# Patient Record
Sex: Male | Born: 1950
Health system: Southern US, Community
[De-identification: ages and names within clinical notes are randomized; demographics above are authoritative.]

## PROBLEM LIST (undated history)

## (undated) DIAGNOSIS — E785 Hyperlipidemia, unspecified: Secondary | ICD-10-CM

## (undated) HISTORY — DX: Hyperlipidemia, unspecified: E78.5

---

## 2017-09-30 DIAGNOSIS — Z136 Encounter for screening for cardiovascular disorders: Secondary | ICD-10-CM | POA: Diagnosis not present

## 2017-09-30 DIAGNOSIS — Z125 Encounter for screening for malignant neoplasm of prostate: Secondary | ICD-10-CM | POA: Diagnosis not present

## 2017-09-30 DIAGNOSIS — I10 Essential (primary) hypertension: Secondary | ICD-10-CM | POA: Diagnosis not present

## 2017-09-30 DIAGNOSIS — Z0000 Encounter for general adult medical examination without abnormal findings: Secondary | ICD-10-CM | POA: Diagnosis not present

## 2017-09-30 DIAGNOSIS — Z6828 Body mass index (BMI) 28.0-28.9, adult: Secondary | ICD-10-CM | POA: Diagnosis not present

## 2017-09-30 DIAGNOSIS — Z1389 Encounter for screening for other disorder: Secondary | ICD-10-CM | POA: Diagnosis not present

## 2017-09-30 DIAGNOSIS — M545 Low back pain: Secondary | ICD-10-CM | POA: Diagnosis not present

## 2017-09-30 DIAGNOSIS — E7800 Pure hypercholesterolemia, unspecified: Secondary | ICD-10-CM | POA: Diagnosis not present

## 2017-09-30 DIAGNOSIS — Z23 Encounter for immunization: Secondary | ICD-10-CM | POA: Diagnosis not present

## 2017-09-30 DIAGNOSIS — N529 Male erectile dysfunction, unspecified: Secondary | ICD-10-CM | POA: Diagnosis not present

## 2018-04-27 DIAGNOSIS — I809 Phlebitis and thrombophlebitis of unspecified site: Secondary | ICD-10-CM | POA: Diagnosis not present

## 2018-04-27 DIAGNOSIS — H9313 Tinnitus, bilateral: Secondary | ICD-10-CM | POA: Diagnosis not present

## 2018-04-27 DIAGNOSIS — K429 Umbilical hernia without obstruction or gangrene: Secondary | ICD-10-CM | POA: Diagnosis not present

## 2018-10-04 DIAGNOSIS — M545 Low back pain: Secondary | ICD-10-CM | POA: Diagnosis not present

## 2018-10-04 DIAGNOSIS — Z0000 Encounter for general adult medical examination without abnormal findings: Secondary | ICD-10-CM | POA: Diagnosis not present

## 2018-10-04 DIAGNOSIS — K429 Umbilical hernia without obstruction or gangrene: Secondary | ICD-10-CM | POA: Diagnosis not present

## 2018-10-04 DIAGNOSIS — N529 Male erectile dysfunction, unspecified: Secondary | ICD-10-CM | POA: Diagnosis not present

## 2018-10-04 DIAGNOSIS — Z125 Encounter for screening for malignant neoplasm of prostate: Secondary | ICD-10-CM | POA: Diagnosis not present

## 2018-10-04 DIAGNOSIS — I10 Essential (primary) hypertension: Secondary | ICD-10-CM | POA: Diagnosis not present

## 2018-10-04 DIAGNOSIS — E7800 Pure hypercholesterolemia, unspecified: Secondary | ICD-10-CM | POA: Diagnosis not present

## 2018-10-04 DIAGNOSIS — H9313 Tinnitus, bilateral: Secondary | ICD-10-CM | POA: Diagnosis not present

## 2018-10-04 DIAGNOSIS — Z1389 Encounter for screening for other disorder: Secondary | ICD-10-CM | POA: Diagnosis not present

## 2018-10-17 DIAGNOSIS — I10 Essential (primary) hypertension: Secondary | ICD-10-CM | POA: Diagnosis not present

## 2018-10-17 DIAGNOSIS — E7800 Pure hypercholesterolemia, unspecified: Secondary | ICD-10-CM | POA: Diagnosis not present

## 2018-10-17 DIAGNOSIS — Z125 Encounter for screening for malignant neoplasm of prostate: Secondary | ICD-10-CM | POA: Diagnosis not present

## 2019-01-02 DIAGNOSIS — H2513 Age-related nuclear cataract, bilateral: Secondary | ICD-10-CM | POA: Diagnosis not present

## 2019-01-02 DIAGNOSIS — H524 Presbyopia: Secondary | ICD-10-CM | POA: Diagnosis not present

## 2019-01-02 DIAGNOSIS — H52223 Regular astigmatism, bilateral: Secondary | ICD-10-CM | POA: Diagnosis not present

## 2019-01-02 DIAGNOSIS — H5203 Hypermetropia, bilateral: Secondary | ICD-10-CM | POA: Diagnosis not present

## 2019-02-01 DIAGNOSIS — Z23 Encounter for immunization: Secondary | ICD-10-CM | POA: Diagnosis not present

## 2019-10-08 ENCOUNTER — Other Ambulatory Visit: Payer: Self-pay | Admitting: Family Medicine

## 2019-10-08 DIAGNOSIS — I739 Peripheral vascular disease, unspecified: Secondary | ICD-10-CM

## 2019-10-16 ENCOUNTER — Ambulatory Visit
Admission: RE | Admit: 2019-10-16 | Discharge: 2019-10-16 | Disposition: A | Discharge status: Home / Self Care | Payer: Medicare Other | Source: Ambulatory Visit | Attending: Family Medicine | Admitting: Family Medicine

## 2019-10-16 DIAGNOSIS — I739 Peripheral vascular disease, unspecified: Secondary | ICD-10-CM

## 2019-10-16 DIAGNOSIS — I700 Atherosclerosis of aorta: Secondary | ICD-10-CM | POA: Diagnosis not present

## 2019-11-15 ENCOUNTER — Ambulatory Visit (INDEPENDENT_AMBULATORY_CARE_PROVIDER_SITE_OTHER): Payer: Medicare Other | Admitting: Vascular Surgery

## 2019-11-15 ENCOUNTER — Other Ambulatory Visit: Payer: Self-pay

## 2019-11-15 ENCOUNTER — Encounter: Payer: Self-pay | Admitting: Vascular Surgery

## 2019-11-15 VITALS — BP 144/79 | HR 73 | Temp 98.1°F | Resp 20 | Ht 69.0 in | Wt 196.0 lb

## 2019-11-15 DIAGNOSIS — I739 Peripheral vascular disease, unspecified: Secondary | ICD-10-CM | POA: Diagnosis not present

## 2019-11-15 NOTE — Progress Notes (Signed)
Referring Physician: Dr Alyson Ingles  Patient name: Tony Mcbride MRN: [id number] DOB: [birth year] Sex: male  REASON FOR CONSULT: Left leg claudication  HPI: Tony Mcbride is a 69 y.o. male, with about a 4 to 6-week history of increasing pain in his left calf with walking.  He is able to walk about 5 to 6 minutes before he experiences this pain.  It then resolves after a few minutes of rest.  He states that he has noticed that the left leg has been a little bit of a problem for maybe about as long as a year but more acute recently.  He does have a history of smoking.  He currently smokes 1 to 2 cigars/day.  He has smoked for about 30 years.  He does not have a history of diabetes.  He does not experience rest pain.  He has no nonhealing wounds.  Other medical problems include hyperlipidemia and he is on a statin for this.  He also was recently started on aspirin 81 mg daily.  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  Past Medical History:  Diagnosis Date   Hyperlipidemia    History reviewed. No pertinent surgical history.  History reviewed. No pertinent family history.  SOCIAL HISTORY: Social History   Socioeconomic History   Marital status: Divorced    Spouse name: Not on file   Number of children: Not on file   Years of education: Not on file   Highest education level: Not on file  Occupational History   Not on file  Tobacco Use   Smoking status: Current Every Day Smoker    Types: Cigars   Smokeless tobacco: Never Used  Vaping Use   Vaping Use: Never used  Substance and Sexual Activity   Alcohol use: Yes   Drug use: Never   Sexual activity: Not on file  Other Topics Concern   Not on file  Social History Narrative   Not on file   Social Determinants of Health   Financial Resource Strain:    Difficulty of Paying Living Expenses:   Food Insecurity:    Worried About Charity fundraiser in the Last Year:    Arboriculturist in  the Last Year:   Transportation Needs:    Film/video editor (Medical):    Lack of Transportation (Non-Medical):   Physical Activity:    Days of Exercise per Week:    Minutes of Exercise per Session:   Stress:    Feeling of Stress :   Social Connections:    Frequency of Communication with Friends and Family:    Frequency of Social Gatherings with Friends and Family:    Attends Religious Services:    Active Member of Clubs or Organizations:    Attends Music therapist:    Marital Status:   Intimate Partner Violence:    Fear of Current or Ex-Partner:    Emotionally Abused:    Physically Abused:    Sexually Abused:     No Known Allergies  Current Outpatient Medications  Medication Sig Dispense Refill   aspirin EC 81 MG tablet Take 81 mg by mouth daily. Swallow whole.     lovastatin (MEVACOR) 40 MG tablet Take 40 mg by mouth daily.     No current facility-administered medications for this visit.    ROS:   General:  No weight loss, Fever, chills  HEENT: No recent headaches, no nasal bleeding, no visual changes, no  sore throat  Neurologic: No dizziness, blackouts, seizures. No recent symptoms of stroke or mini- stroke. No recent episodes of slurred speech, or temporary blindness.  Cardiac: No recent episodes of chest pain/pressure, no shortness of breath at rest.  No shortness of breath with exertion.  Denies history of atrial fibrillation or irregular heartbeat  Vascular: No history of rest pain in feet.  No history of claudication.  No history of non-healing ulcer, No history of DVT   Pulmonary: No home oxygen, no productive cough, no hemoptysis,  No asthma or wheezing  Musculoskeletal:  [ ]  Arthritis, [ ]  Low back pain,  [ ]  Joint pain  Hematologic:No history of hypercoagulable state.  No history of easy bleeding.  No history of anemia  Gastrointestinal: No hematochezia or melena,  No gastroesophageal reflux, no trouble  swallowing  Urinary: [ ]  chronic Kidney disease, [ ]  on HD - [ ]  MWF or [ ]  TTHS, [ ]  Burning with urination, [ ]  Frequent urination, [ ]  Difficulty urinating;   Skin: No rashes  Psychological: No history of anxiety,  No history of depression   Physical Examination  Vitals:   11/15/19 0826  BP: (!) 144/79  Pulse: 73  Resp: 20  Temp: 98.1 F (36.7 C)  SpO2: 96%  Weight: 196 lb (88.9 kg)  Height: 5\' 9"  (1.753 m)    Body mass index is 28.94 kg/m.  General:  Alert and oriented, no acute distress HEENT: Normal Neck: No JVD Cardiac: Regular Rate and Rhythm Abdomen: Soft, non-tender, non-distended, no mass, umbilical hernia Skin: No rash Extremity Pulses:  2+ radial, brachial, femoral, 1 + right popliteal dorsalis pedis, 2+ right absent left posterior tibial pulses absent DP bilaterally Musculoskeletal: No deformity or edema  Neurologic: Upper and lower extremity motor 5/5 and symmetric  DATA:  I reviewed the patient's recent ABIs from [facility] imaging from October 16, 2019.  Right side was 0.97 left side 0.52 waveforms consistent with left SFA occlusion.  Also suggestion with discrepancy in blood pressures right versus left arm of possible subclavian stenosis on the right.  The report did not mention actual numbers.  ASSESSMENT: Claudication left leg secondary most likely to left SFA occlusion.  Patient is limited a little bit in his walking distance but currently does not feel completely disabled by this.  He is not currently at risk of limb loss.  I discussed with him that if he is able to quit tobacco products completely his risk of limb loss lifetime is less than 5%.  As far as the right versus left blood pressure discrepancy is concerned his pulses are fairly symmetric so I do not believe this is necessarily a flow-limiting or clinically relevant stenosis.   PLAN: 1.  Continue aspirin and statin  2.  Patient will follow up with [place] in 6 months time in our APP clinic for repeat  ABIs.  3.  Patient will start an exercise program of walking for 30 minutes daily to try to increase his walking distance overall.  I did discuss with the patient the possibility of a intervention with arteriogram possible angioplasty or stenting.  He currently does not feel his lifestyle is limited enough to consider this for now.  We also discussed durability with this.  If he changes his mind we would consider that in the future.   Ruta Hinds, MD [facility] [place] Office: [phone]

## 2019-11-16 ENCOUNTER — Other Ambulatory Visit: Payer: Self-pay | Admitting: *Deleted

## 2019-11-16 DIAGNOSIS — I739 Peripheral vascular disease, unspecified: Secondary | ICD-10-CM

## 2019-11-26 DIAGNOSIS — E1159 Type 2 diabetes mellitus with other circulatory complications: Secondary | ICD-10-CM | POA: Diagnosis not present

## 2019-11-26 DIAGNOSIS — I739 Peripheral vascular disease, unspecified: Secondary | ICD-10-CM | POA: Diagnosis not present

## 2020-01-15 DIAGNOSIS — Z23 Encounter for immunization: Secondary | ICD-10-CM | POA: Diagnosis not present

## 2020-02-22 DIAGNOSIS — Z23 Encounter for immunization: Secondary | ICD-10-CM | POA: Diagnosis not present

## 2020-04-18 DIAGNOSIS — E119 Type 2 diabetes mellitus without complications: Secondary | ICD-10-CM | POA: Diagnosis not present

## 2020-04-18 DIAGNOSIS — Z1211 Encounter for screening for malignant neoplasm of colon: Secondary | ICD-10-CM | POA: Diagnosis not present

## 2020-04-18 DIAGNOSIS — K5730 Diverticulosis of large intestine without perforation or abscess without bleeding: Secondary | ICD-10-CM | POA: Diagnosis not present

## 2020-04-18 DIAGNOSIS — K635 Polyp of colon: Secondary | ICD-10-CM | POA: Diagnosis not present

## 2020-04-18 DIAGNOSIS — D120 Benign neoplasm of cecum: Secondary | ICD-10-CM | POA: Diagnosis not present

## 2020-04-18 DIAGNOSIS — D123 Benign neoplasm of transverse colon: Secondary | ICD-10-CM | POA: Diagnosis not present

## 2020-05-13 ENCOUNTER — Ambulatory Visit (HOSPITAL_COMMUNITY)
Admission: RE | Admit: 2020-05-13 | Discharge: 2020-05-13 | Disposition: A | Discharge status: Home / Self Care | Payer: Medicare Other | Source: Ambulatory Visit | Attending: Physician Assistant | Admitting: Physician Assistant

## 2020-05-13 ENCOUNTER — Ambulatory Visit (INDEPENDENT_AMBULATORY_CARE_PROVIDER_SITE_OTHER): Payer: Medicare Other | Admitting: Physician Assistant

## 2020-05-13 ENCOUNTER — Other Ambulatory Visit: Payer: Self-pay

## 2020-05-13 VITALS — BP 130/66 | HR 74 | Temp 98.6°F | Resp 20 | Ht 69.0 in | Wt 178.5 lb

## 2020-05-13 DIAGNOSIS — I70212 Atherosclerosis of native arteries of extremities with intermittent claudication, left leg: Secondary | ICD-10-CM

## 2020-05-13 DIAGNOSIS — I739 Peripheral vascular disease, unspecified: Secondary | ICD-10-CM | POA: Diagnosis not present

## 2020-05-13 NOTE — Progress Notes (Signed)
Established Intermittent Claudication   History of Present Illness   Tony Mcbride is a 70 y.o. (June 25, 1950) male who presents to go over vascular studies related to PAD.  Patient has a known left SFA occlusion.  He was evaluated by Dr. Oneida Alar 6 months ago and was started on a exercise program.  Patient states 6 months ago he was only able to walk for about 3 to 5 minutes before having to rest due to severe left calf claudication pain.  After focusing on walking and exercising over the last 6 months the is now able to walk about 30 minutes before he experiences left calf claudication.  He has also cut down on tobacco use from 4 cigars daily to about 1 to 2 cigars/week.  He denies any rest pain or nonhealing wounds of bilateral lower extremities.  He is taking aspirin daily.   The patient's PMH, PSH, SH, and FamHx were reviewed and are unchanged from prior visit.  Current Outpatient Medications  Medication Sig Dispense Refill  . aspirin EC 81 MG tablet Take 81 mg by mouth daily. Swallow whole.    . Blood Glucose Monitoring Suppl (ONETOUCH VERIO REFLECT) w/Device KIT See admin instructions.    Marland Kitchen glucose blood (ONETOUCH VERIO) test strip for use when checking blood sugars    . Lancets (ONETOUCH DELICA PLUS [id number]) MISC for use when checking home glucose readings    . lovastatin (MEVACOR) 40 MG tablet Take 40 mg by mouth daily.    . Multiple Vitamin (MULTIVITAMIN ADULT PO) 1 tablet    . naproxen sodium (ALEVE) 220 MG tablet 2 1/2 tablet    . ONETOUCH VERIO test strip 2 (two) times daily.     No current facility-administered medications for this visit.    REVIEW OF SYSTEMS (negative unless checked):   Cardiac:  '[]'  Chest pain or chest pressure? '[]'  Shortness of breath upon activity? '[]'  Shortness of breath when lying flat? '[]'  Irregular heart rhythm?  Vascular:  '[]'  Pain in calf, thigh, or hip brought on by walking? '[]'  Pain in feet at night that wakes you up from your sleep? '[]'  Blood  clot in your veins? '[]'  Leg swelling?  Pulmonary:  '[]'  Oxygen at home? '[]'  Productive cough? '[]'  Wheezing?  Neurologic:  '[]'  Sudden weakness in arms or legs? '[]'  Sudden numbness in arms or legs? '[]'  Sudden onset of difficult speaking or slurred speech? '[]'  Temporary loss of vision in one eye? '[]'  Problems with dizziness?  Gastrointestinal:  '[]'  Blood in stool? '[]'  Vomited blood?  Genitourinary:  '[]'  Burning when urinating? '[]'  Blood in urine?  Psychiatric:  '[]'  Major depression  Hematologic:  '[]'  Bleeding problems? '[]'  Problems with blood clotting?  Dermatologic:  '[]'  Rashes or ulcers?  Constitutional:  '[]'  Fever or chills?  Ear/Nose/Throat:  '[]'  Change in hearing? '[]'  Nose bleeds? '[]'  Sore throat?  Musculoskeletal:  '[]'  Back pain? '[]'  Joint pain? '[]'  Muscle pain?   Physical Examination   Vitals:   05/13/20 1127  BP: 130/66  Pulse: 74  Resp: 20  Temp: 98.6 F (37 C)  TempSrc: Temporal  SpO2: 97%  Weight: 178 lb 8 oz (81 kg)  Height: '5\' 9"'  (1.753 m)   Body mass index is 26.36 kg/m.  General:  WDWN in NAD; vital signs documented above Gait: Not observed HENT: WNL, normocephalic Pulmonary: normal non-labored breathing , without Rales, rhonchi,  wheezing Cardiac: regular HR Abdomen: soft, NT, no masses Skin: without rashes Vascular Exam/Pulses:  Right Left  Radial  2+ (normal) 2+ (normal)  DP 2+ (normal) 2+ (normal)  PT 2+ (normal) absent   Extremities: without ischemic changes, without Gangrene , without cellulitis; without open wounds;  Musculoskeletal: no muscle wasting or atrophy  Neurologic: A&O X 3;  No focal weakness or paresthesias are detected Psychiatric:  The pt has Normal affect.  Non-Invasive Vascular imaging   ABI  ABI/TBIToday's ABIToday's TBIPrevious ABIPrevious TBI  +-------+-----------+-----------+------------+------------+  Right 1.11    0.67    0.97    not obtained   +-------+-----------+-----------+------------+------------+  Left  0.87    0.55    0.52    not obtained   Medical Decision Making   Tony Mcbride is a 70 y.o. male who presents with PAD with known L SFA occlusion   Subjectively, claudication symptoms are much more mild after focusing on exercising and walking over the past 6 months  ABIs improved  Patient also drastically cut down on tobacco use; encouraged cessation  Continue aspirin and statin daily  Recheck ABIs in 1 year; can probably follow prn if no problems at that time   Dagoberto Ligas PA-C [facility] [place] [facility] Office: [facility] MD: Carlis Abbott

## 2020-05-28 DIAGNOSIS — R030 Elevated blood-pressure reading, without diagnosis of hypertension: Secondary | ICD-10-CM | POA: Diagnosis not present

## 2020-05-28 DIAGNOSIS — R739 Hyperglycemia, unspecified: Secondary | ICD-10-CM | POA: Diagnosis not present

## 2020-05-30 DIAGNOSIS — K429 Umbilical hernia without obstruction or gangrene: Secondary | ICD-10-CM | POA: Diagnosis not present

## 2020-06-02 DIAGNOSIS — Z809 Family history of malignant neoplasm, unspecified: Secondary | ICD-10-CM | POA: Diagnosis not present

## 2020-06-02 DIAGNOSIS — Z7982 Long term (current) use of aspirin: Secondary | ICD-10-CM | POA: Diagnosis not present

## 2020-06-02 DIAGNOSIS — N3289 Other specified disorders of bladder: Secondary | ICD-10-CM | POA: Diagnosis not present

## 2020-06-02 DIAGNOSIS — Z8249 Family history of ischemic heart disease and other diseases of the circulatory system: Secondary | ICD-10-CM | POA: Diagnosis not present

## 2020-06-02 DIAGNOSIS — K429 Umbilical hernia without obstruction or gangrene: Secondary | ICD-10-CM | POA: Diagnosis not present

## 2020-06-02 DIAGNOSIS — I739 Peripheral vascular disease, unspecified: Secondary | ICD-10-CM | POA: Diagnosis not present

## 2020-06-02 DIAGNOSIS — F17200 Nicotine dependence, unspecified, uncomplicated: Secondary | ICD-10-CM | POA: Diagnosis not present

## 2020-06-02 DIAGNOSIS — N400 Enlarged prostate without lower urinary tract symptoms: Secondary | ICD-10-CM | POA: Diagnosis not present

## 2020-06-02 DIAGNOSIS — K420 Umbilical hernia with obstruction, without gangrene: Secondary | ICD-10-CM | POA: Diagnosis not present

## 2020-06-02 DIAGNOSIS — Z20822 Contact with and (suspected) exposure to covid-19: Secondary | ICD-10-CM | POA: Diagnosis not present

## 2020-06-02 DIAGNOSIS — K6389 Other specified diseases of intestine: Secondary | ICD-10-CM | POA: Diagnosis not present

## 2020-06-02 DIAGNOSIS — R109 Unspecified abdominal pain: Secondary | ICD-10-CM | POA: Diagnosis not present

## 2020-06-02 DIAGNOSIS — N179 Acute kidney failure, unspecified: Secondary | ICD-10-CM | POA: Diagnosis not present

## 2020-06-02 DIAGNOSIS — R110 Nausea: Secondary | ICD-10-CM | POA: Diagnosis not present

## 2020-08-07 DIAGNOSIS — Z23 Encounter for immunization: Secondary | ICD-10-CM | POA: Diagnosis not present

## 2020-10-15 DIAGNOSIS — Z1389 Encounter for screening for other disorder: Secondary | ICD-10-CM | POA: Diagnosis not present

## 2020-10-15 DIAGNOSIS — R7309 Other abnormal glucose: Secondary | ICD-10-CM | POA: Diagnosis not present

## 2020-10-15 DIAGNOSIS — E7800 Pure hypercholesterolemia, unspecified: Secondary | ICD-10-CM | POA: Diagnosis not present

## 2020-10-15 DIAGNOSIS — E1169 Type 2 diabetes mellitus with other specified complication: Secondary | ICD-10-CM | POA: Diagnosis not present

## 2020-10-15 DIAGNOSIS — Z136 Encounter for screening for cardiovascular disorders: Secondary | ICD-10-CM | POA: Diagnosis not present

## 2020-10-15 DIAGNOSIS — Z0000 Encounter for general adult medical examination without abnormal findings: Secondary | ICD-10-CM | POA: Diagnosis not present

## 2020-10-15 DIAGNOSIS — Z125 Encounter for screening for malignant neoplasm of prostate: Secondary | ICD-10-CM | POA: Diagnosis not present

## 2020-10-17 ENCOUNTER — Other Ambulatory Visit: Payer: Self-pay | Admitting: Family Medicine

## 2020-10-17 DIAGNOSIS — Z136 Encounter for screening for cardiovascular disorders: Secondary | ICD-10-CM

## 2020-10-20 DIAGNOSIS — Z20822 Contact with and (suspected) exposure to covid-19: Secondary | ICD-10-CM | POA: Diagnosis not present

## 2020-11-10 ENCOUNTER — Ambulatory Visit
Admission: RE | Admit: 2020-11-10 | Discharge: 2020-11-10 | Disposition: A | Discharge status: Home / Self Care | Payer: Medicare Other | Source: Ambulatory Visit | Attending: Family Medicine | Admitting: Family Medicine

## 2020-11-10 ENCOUNTER — Other Ambulatory Visit: Payer: Self-pay

## 2020-11-10 DIAGNOSIS — Z136 Encounter for screening for cardiovascular disorders: Secondary | ICD-10-CM

## 2020-11-10 DIAGNOSIS — Z87891 Personal history of nicotine dependence: Secondary | ICD-10-CM | POA: Diagnosis not present

## 2020-11-19 DIAGNOSIS — L570 Actinic keratosis: Secondary | ICD-10-CM | POA: Diagnosis not present

## 2020-11-19 DIAGNOSIS — L821 Other seborrheic keratosis: Secondary | ICD-10-CM | POA: Diagnosis not present

## 2020-11-19 DIAGNOSIS — D225 Melanocytic nevi of trunk: Secondary | ICD-10-CM | POA: Diagnosis not present

## 2020-11-19 DIAGNOSIS — D485 Neoplasm of uncertain behavior of skin: Secondary | ICD-10-CM | POA: Diagnosis not present

## 2020-11-25 DIAGNOSIS — L570 Actinic keratosis: Secondary | ICD-10-CM | POA: Diagnosis not present

## 2020-11-25 DIAGNOSIS — L814 Other melanin hyperpigmentation: Secondary | ICD-10-CM | POA: Diagnosis not present

## 2020-11-25 DIAGNOSIS — D485 Neoplasm of uncertain behavior of skin: Secondary | ICD-10-CM | POA: Diagnosis not present

## 2020-11-25 DIAGNOSIS — L720 Epidermal cyst: Secondary | ICD-10-CM | POA: Diagnosis not present

## 2020-11-25 DIAGNOSIS — D044 Carcinoma in situ of skin of scalp and neck: Secondary | ICD-10-CM | POA: Diagnosis not present

## 2020-11-25 DIAGNOSIS — C439 Malignant melanoma of skin, unspecified: Secondary | ICD-10-CM | POA: Diagnosis not present

## 2021-01-02 DIAGNOSIS — Z23 Encounter for immunization: Secondary | ICD-10-CM | POA: Diagnosis not present

## 2021-01-13 DIAGNOSIS — D044 Carcinoma in situ of skin of scalp and neck: Secondary | ICD-10-CM | POA: Diagnosis not present

## 2021-04-16 DIAGNOSIS — D485 Neoplasm of uncertain behavior of skin: Secondary | ICD-10-CM | POA: Diagnosis not present

## 2021-04-16 DIAGNOSIS — L905 Scar conditions and fibrosis of skin: Secondary | ICD-10-CM | POA: Diagnosis not present

## 2021-04-16 DIAGNOSIS — Z85828 Personal history of other malignant neoplasm of skin: Secondary | ICD-10-CM | POA: Diagnosis not present

## 2021-04-21 DIAGNOSIS — L820 Inflamed seborrheic keratosis: Secondary | ICD-10-CM | POA: Diagnosis not present

## 2021-04-21 DIAGNOSIS — D485 Neoplasm of uncertain behavior of skin: Secondary | ICD-10-CM | POA: Diagnosis not present

## 2021-05-21 ENCOUNTER — Other Ambulatory Visit: Payer: Self-pay

## 2021-05-21 DIAGNOSIS — I739 Peripheral vascular disease, unspecified: Secondary | ICD-10-CM

## 2021-05-28 ENCOUNTER — Ambulatory Visit (HOSPITAL_COMMUNITY)
Admission: RE | Admit: 2021-05-28 | Discharge: 2021-05-28 | Disposition: A | Discharge status: Home / Self Care | Payer: Medicare Other | Source: Ambulatory Visit | Attending: Vascular Surgery | Admitting: Vascular Surgery

## 2021-05-28 ENCOUNTER — Other Ambulatory Visit: Payer: Self-pay

## 2021-05-28 ENCOUNTER — Ambulatory Visit (INDEPENDENT_AMBULATORY_CARE_PROVIDER_SITE_OTHER): Payer: Medicare Other | Admitting: Physician Assistant

## 2021-05-28 VITALS — BP 129/66 | HR 71 | Temp 98.2°F | Resp 20 | Ht 69.0 in | Wt 173.9 lb

## 2021-05-28 DIAGNOSIS — I70212 Atherosclerosis of native arteries of extremities with intermittent claudication, left leg: Secondary | ICD-10-CM

## 2021-05-28 DIAGNOSIS — I739 Peripheral vascular disease, unspecified: Secondary | ICD-10-CM | POA: Diagnosis not present

## 2021-05-28 NOTE — Progress Notes (Signed)
Office Note     CC:  follow up Requesting Provider:  Maury Dus, MD  HPI: Tony Mcbride is a 71 y.o. ([birth year]) male who presents for surveillance of PAD.  He has a known left SFA occlusion.  He was initially seen by Dr. Oneida Alar who recommended a walking program as well as weight loss.  He has increased his walking distance prior to experiencing claudication symptoms of his left calf drastically since he was seen by Dr. Oneida Alar in August 2021.  He continues to walk every day.  Claudication symptoms are still tolerable and mild.  He denies any rest pain or tissue loss of bilateral lower extremities.  He states he is starting to have some claudication symptoms of his right leg however these are also mild.  He smokes a cigar every day.  He is taking an aspirin and statin daily.   Past Medical History:  Diagnosis Date   Hyperlipidemia     History reviewed. No pertinent surgical history.  Social History   Socioeconomic History   Marital status: Divorced    Spouse name: Not on file   Number of children: Not on file   Years of education: Not on file   Highest education level: Not on file  Occupational History   Not on file  Tobacco Use   Smoking status: Every Day    Types: Cigars   Smokeless tobacco: Never  Vaping Use   Vaping Use: Never used  Substance and Sexual Activity   Alcohol use: Yes   Drug use: Never   Sexual activity: Not on file  Other Topics Concern   Not on file  Social History Narrative   Not on file   Social Determinants of Health   Financial Resource Strain: Not on file  Food Insecurity: Not on file  Transportation Needs: Not on file  Physical Activity: Not on file  Stress: Not on file  Social Connections: Not on file  Intimate Partner Violence: Not on file   History reviewed. No pertinent family history.  Current Outpatient Medications  Medication Sig Dispense Refill   aspirin EC 81 MG tablet Take 81 mg by mouth daily. Swallow whole.     Blood  Glucose Monitoring Suppl (ONETOUCH VERIO REFLECT) w/Device KIT See admin instructions.     glucose blood (ONETOUCH VERIO) test strip for use when checking blood sugars     Lancets (ONETOUCH DELICA PLUS [id number]) MISC for use when checking home glucose readings     lovastatin (MEVACOR) 40 MG tablet Take 40 mg by mouth daily.     Multiple Vitamin (MULTIVITAMIN ADULT PO) 1 tablet     naproxen sodium (ALEVE) 220 MG tablet 2 1/2 tablet     ONETOUCH VERIO test strip 2 (two) times daily.     No current facility-administered medications for this visit.    No Known Allergies   REVIEW OF SYSTEMS:   _0  denotes positive finding, _1  denotes negative finding Cardiac  Comments:  Chest pain or chest pressure:    Shortness of breath upon exertion:    Short of breath when lying flat:    Irregular heart rhythm:        Vascular    Pain in calf, thigh, or hip brought on by ambulation:    Pain in feet at night that wakes you up from your sleep:     Blood clot in your veins:    Leg swelling:         Pulmonary  Oxygen at home:    Productive cough:     Wheezing:         Neurologic    Sudden weakness in arms or legs:     Sudden numbness in arms or legs:     Sudden onset of difficulty speaking or slurred speech:    Temporary loss of vision in one eye:     Problems with dizziness:         Gastrointestinal    Blood in stool:     Vomited blood:         Genitourinary    Burning when urinating:     Blood in urine:        Psychiatric    Major depression:         Hematologic    Bleeding problems:    Problems with blood clotting too easily:        Skin    Rashes or ulcers:        Constitutional    Fever or chills:      PHYSICAL EXAMINATION:  Vitals:   05/28/21 0937  BP: 129/66  Pulse: 71  Resp: 20  Temp: 98.2 F (36.8 C)  TempSrc: Temporal  SpO2: 99%  Weight: 173 lb 14.4 oz (78.9 kg)  Height: _0  (1.753 m)    General:  WDWN in NAD; vital signs documented above Gait:  Not observed HENT: WNL, normocephalic Pulmonary: normal non-labored breathing , without Rales, rhonchi,  wheezing Cardiac: regular HR Abdomen: soft, NT, no masses Skin: without rashes Vascular Exam/Pulses:  Right Left  Radial 2+ (normal) 2+ (normal)  DP absent absent  PT 2+ (normal) absent   Extremities: without ischemic changes, without Gangrene , without cellulitis; without open wounds;  Musculoskeletal: no muscle wasting or atrophy  Neurologic: A&O X 3;  No focal weakness or paresthesias are detected Psychiatric:  The pt has Normal affect.   Non-Invasive Vascular Imaging:   ABI/TBI Today's ABI Today's TBI Previous ABI Previous TBI   +-------+-----------+-----------+------------+------------+   Right   0.93        0.46        1.11         0.67           +-------+-----------+-----------+------------+------------+   Left    1.01        0.51        0.87         0.55              ASSESSMENT/PLAN:: 71 y.o. male here for follow up for surveillance of PAD  -Claudication symptoms of left lower extremity are stable.  He is starting to have claudication symptoms of his right leg despite a palpable right PT pulse.  He denies any rest pain or tissue loss.  No indication for further work-up at this time -Encouraged patient to continue current exercise regimen -Encourage smoking cessation -Recheck ABI in 1 year per protocol -Continue aspirin and statin daily   Dagoberto Ligas, PA-C [facility] [phone]  Clinic MD:   Trula Slade

## 2021-11-17 DIAGNOSIS — E7800 Pure hypercholesterolemia, unspecified: Secondary | ICD-10-CM | POA: Diagnosis not present

## 2021-11-17 DIAGNOSIS — Z1331 Encounter for screening for depression: Secondary | ICD-10-CM | POA: Diagnosis not present

## 2021-11-17 DIAGNOSIS — Z125 Encounter for screening for malignant neoplasm of prostate: Secondary | ICD-10-CM | POA: Diagnosis not present

## 2021-11-17 DIAGNOSIS — Z0000 Encounter for general adult medical examination without abnormal findings: Secondary | ICD-10-CM | POA: Diagnosis not present

## 2021-11-17 DIAGNOSIS — E785 Hyperlipidemia, unspecified: Secondary | ICD-10-CM | POA: Diagnosis not present

## 2021-11-17 DIAGNOSIS — L821 Other seborrheic keratosis: Secondary | ICD-10-CM | POA: Diagnosis not present

## 2021-11-17 DIAGNOSIS — E1169 Type 2 diabetes mellitus with other specified complication: Secondary | ICD-10-CM | POA: Diagnosis not present

## 2021-11-17 DIAGNOSIS — M7662 Achilles tendinitis, left leg: Secondary | ICD-10-CM | POA: Diagnosis not present

## 2021-12-10 DIAGNOSIS — L570 Actinic keratosis: Secondary | ICD-10-CM | POA: Diagnosis not present

## 2021-12-10 DIAGNOSIS — D485 Neoplasm of uncertain behavior of skin: Secondary | ICD-10-CM | POA: Diagnosis not present

## 2021-12-10 DIAGNOSIS — L821 Other seborrheic keratosis: Secondary | ICD-10-CM | POA: Diagnosis not present

## 2021-12-10 DIAGNOSIS — L905 Scar conditions and fibrosis of skin: Secondary | ICD-10-CM | POA: Diagnosis not present

## 2021-12-10 DIAGNOSIS — Z85828 Personal history of other malignant neoplasm of skin: Secondary | ICD-10-CM | POA: Diagnosis not present

## 2022-01-07 DIAGNOSIS — L570 Actinic keratosis: Secondary | ICD-10-CM | POA: Diagnosis not present

## 2022-01-07 DIAGNOSIS — D225 Melanocytic nevi of trunk: Secondary | ICD-10-CM | POA: Diagnosis not present

## 2022-01-07 DIAGNOSIS — D485 Neoplasm of uncertain behavior of skin: Secondary | ICD-10-CM | POA: Diagnosis not present

## 2022-01-18 DIAGNOSIS — Z23 Encounter for immunization: Secondary | ICD-10-CM | POA: Diagnosis not present

## 2022-01-29 DIAGNOSIS — Z23 Encounter for immunization: Secondary | ICD-10-CM | POA: Diagnosis not present

## 2022-03-11 DIAGNOSIS — L570 Actinic keratosis: Secondary | ICD-10-CM | POA: Diagnosis not present

## 2022-03-11 DIAGNOSIS — Z86018 Personal history of other benign neoplasm: Secondary | ICD-10-CM | POA: Diagnosis not present

## 2022-03-11 DIAGNOSIS — L905 Scar conditions and fibrosis of skin: Secondary | ICD-10-CM | POA: Diagnosis not present

## 2022-09-29 DIAGNOSIS — H2513 Age-related nuclear cataract, bilateral: Secondary | ICD-10-CM | POA: Diagnosis not present

## 2022-10-13 IMAGING — US US ABDOMINAL AORTA SCREENING AAA
1 series · 8 of 8 positions shown · non-contrast
Comparison: None.

CLINICAL DATA: Male between 65-75 years of age with a smoking
history.

EXAM:
US ABDOMINAL AORTA MEDICARE SCREENING
TECHNIQUE: Ultrasound examination of the abdominal aorta was performed as a
screening evaluation for abdominal aortic aneurysm.

[Series 1: us abdominal aorta screening aaa · 0.28mm/px · 8 of 8 slices shown]
[im 1/8]
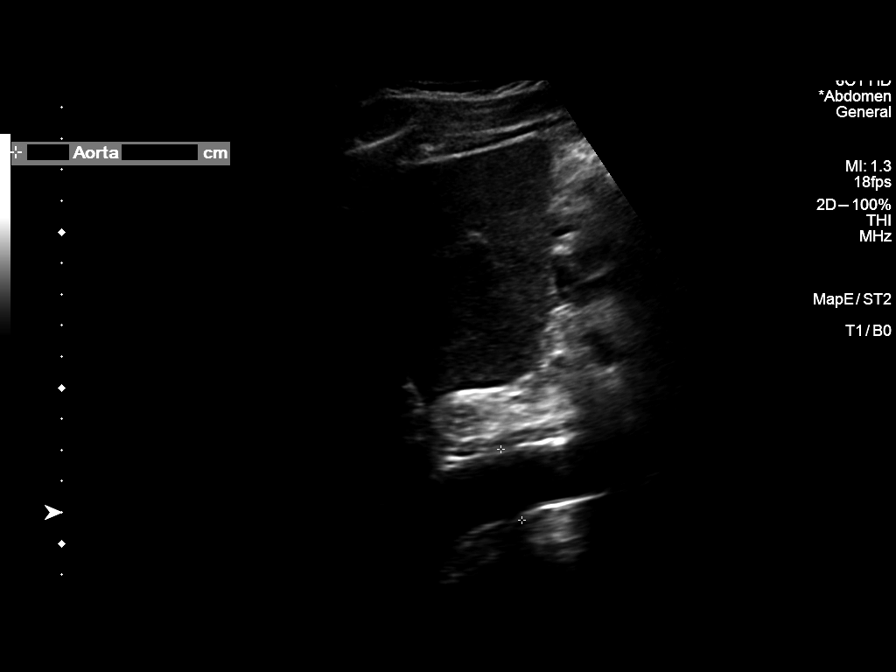
[im 2/8]
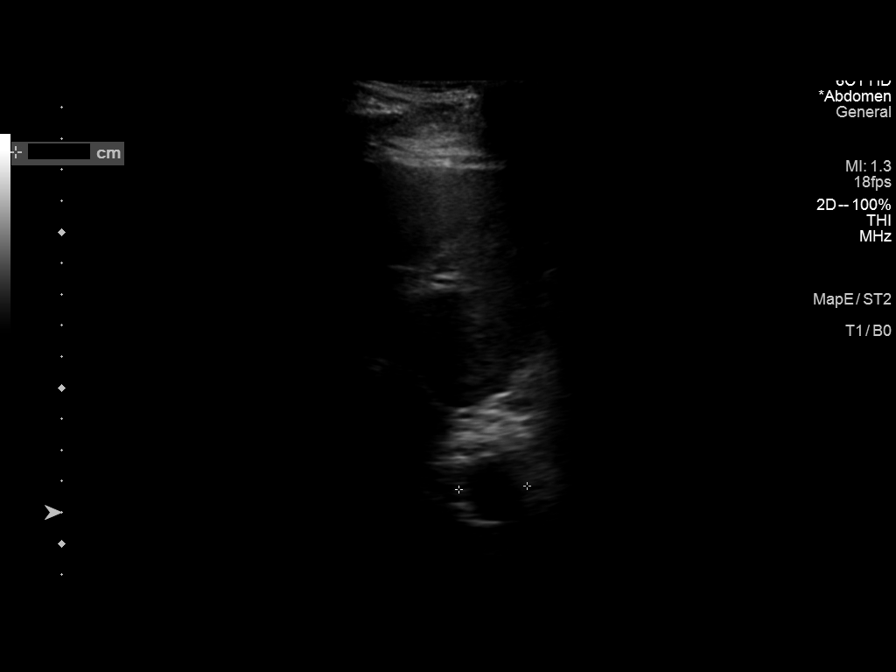
[im 3/8]
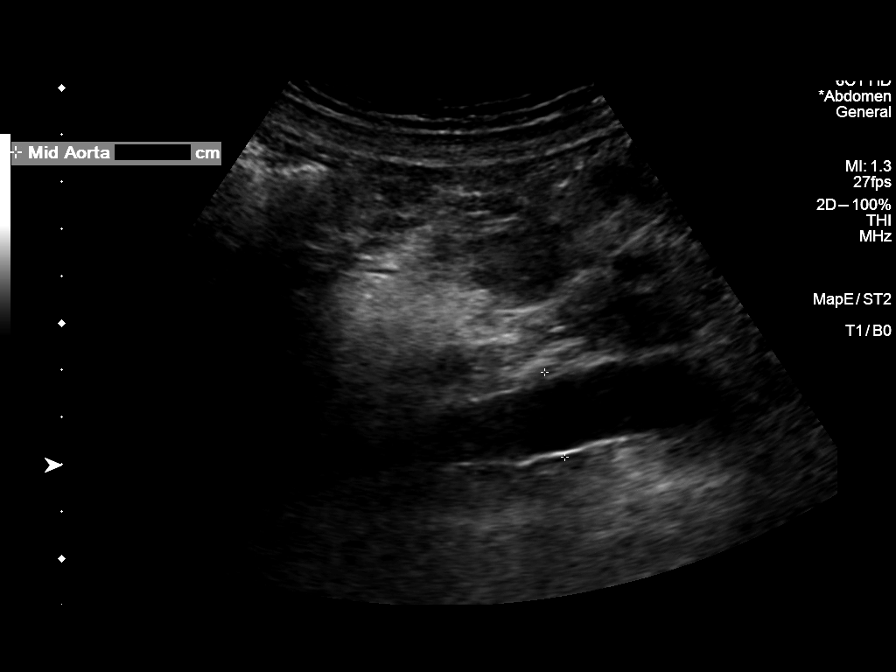
[im 4/8]
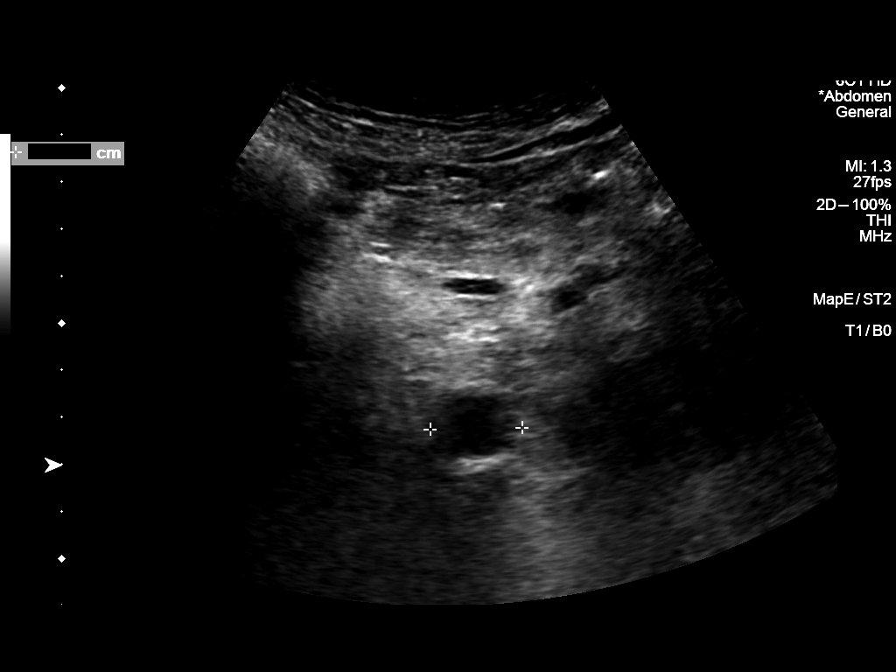
[im 5/8]
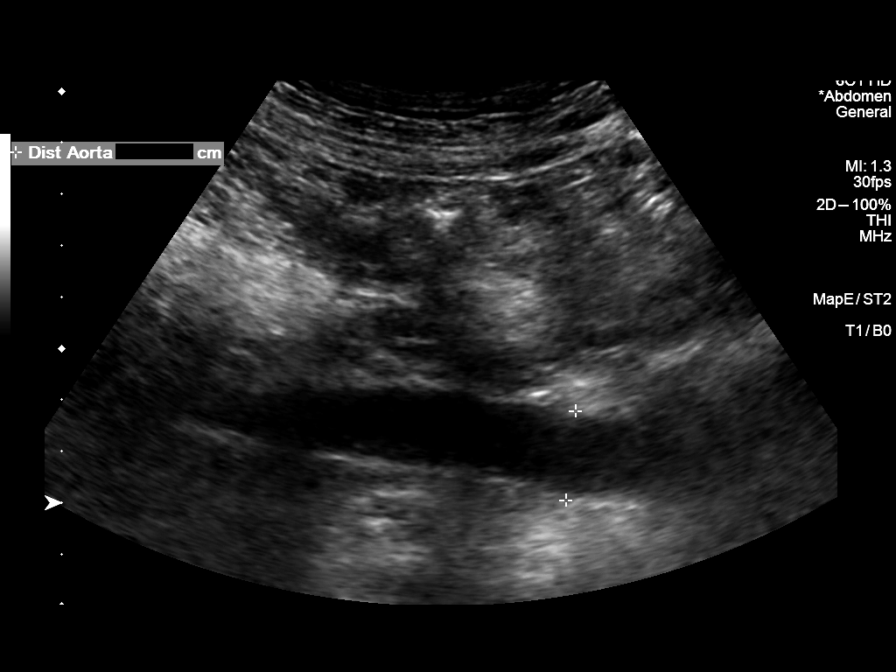
[im 6/8]
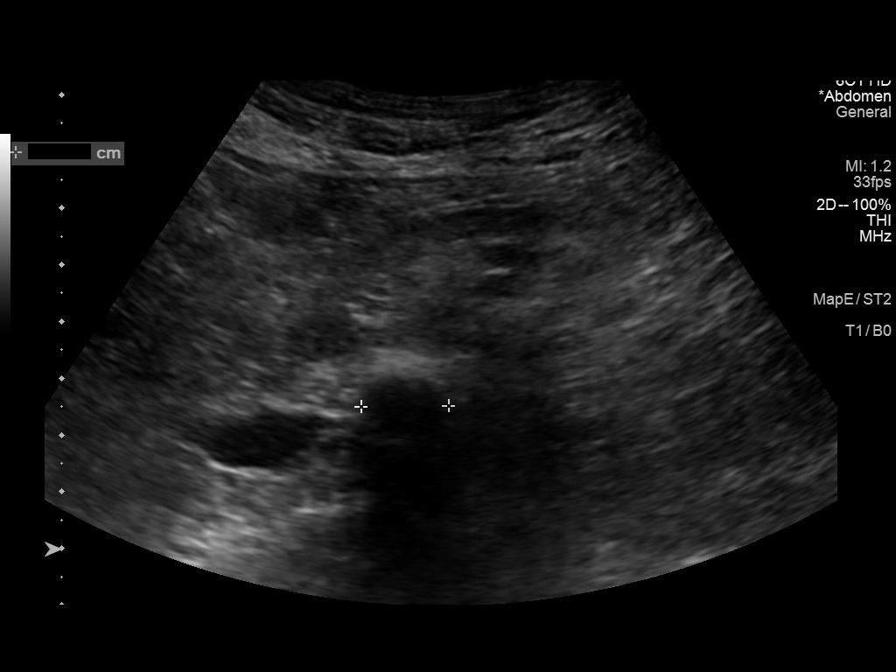
[im 7/8]
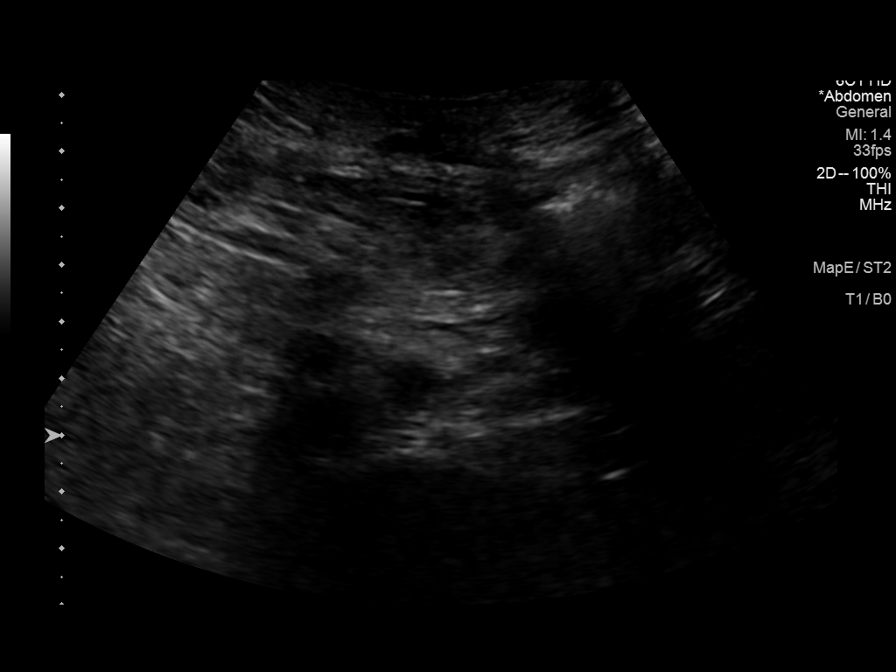
[im 8/8]
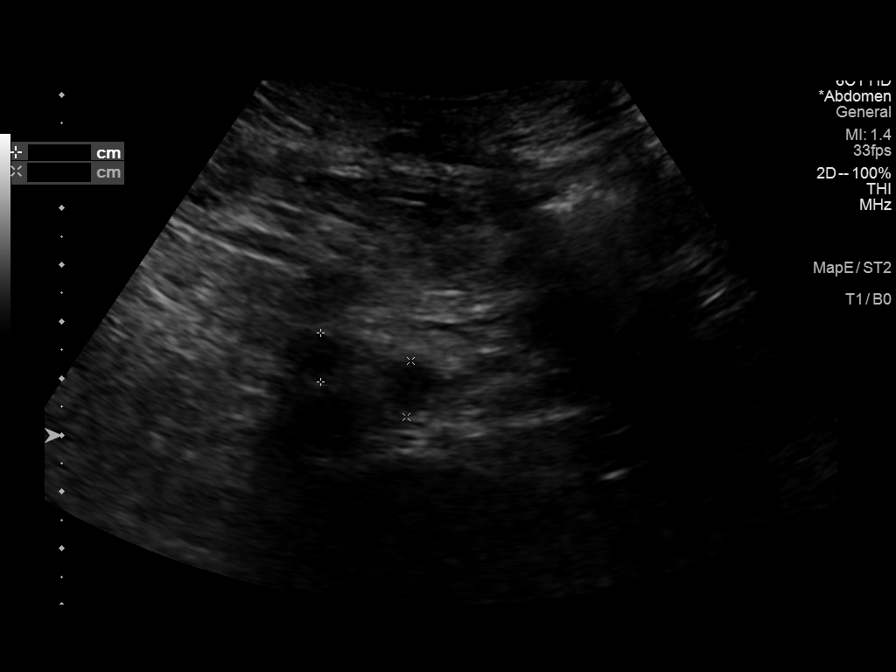

[8 of 8 positions shown; findings below may reference images not displayed]

FINDINGS: Abdominal aortic measurements as follows:

Proximal:  2.2 x 2.4 cm

Mid:  1.9 x 2.0 cm

Distal:  1.5 x 1.7 cm
IMPRESSION: No evidence of abdominal aortic aneurysm.

## 2022-10-19 ENCOUNTER — Other Ambulatory Visit: Payer: Self-pay | Admitting: *Deleted

## 2022-10-19 DIAGNOSIS — I739 Peripheral vascular disease, unspecified: Secondary | ICD-10-CM

## 2022-10-19 DIAGNOSIS — I70212 Atherosclerosis of native arteries of extremities with intermittent claudication, left leg: Secondary | ICD-10-CM

## 2022-11-04 ENCOUNTER — Ambulatory Visit (HOSPITAL_COMMUNITY)
Admission: RE | Admit: 2022-11-04 | Discharge: 2022-11-04 | Disposition: A | Discharge status: Home / Self Care | Payer: Medicare Other | Source: Ambulatory Visit | Attending: Vascular Surgery | Admitting: Vascular Surgery

## 2022-11-04 ENCOUNTER — Ambulatory Visit (INDEPENDENT_AMBULATORY_CARE_PROVIDER_SITE_OTHER): Payer: Medicare Other | Admitting: Physician Assistant

## 2022-11-04 VITALS — BP 152/72 | HR 76 | Temp 98.0°F | Resp 18 | Ht 69.0 in | Wt 178.0 lb

## 2022-11-04 DIAGNOSIS — I70212 Atherosclerosis of native arteries of extremities with intermittent claudication, left leg: Secondary | ICD-10-CM | POA: Diagnosis not present

## 2022-11-04 DIAGNOSIS — I739 Peripheral vascular disease, unspecified: Secondary | ICD-10-CM | POA: Diagnosis not present

## 2022-11-04 LAB — VAS US ABI WITH/WO TBI
Left ABI: 0.95
Right ABI: 0.97

## 2022-11-04 NOTE — Progress Notes (Signed)
Office Note     CC:  follow up Requesting Provider:  No ref. provider found  HPI: Tony Mcbride is a 72 y.o. (1950-11-02) male who presents for follow up of PAD. He has a known left SFA occlusion. He has been managing his claudication symptoms with lifestyle modifications.The left and right leg are about the same as far as walking tolerance.  He exercises 5x per week. He usually does the elliptical for 30 minutes and then walks. He says when he does this he can only walk about 10-15 minutes but if he just walks he can walk for 45 minutes or so. He says since his last visit his symptoms are about the same. He does not have any pain at rest or non healing wounds. He continues to smoke a cigar daily. He is medically managed on Aspirin and Statin.   Past Medical History:  Diagnosis Date   Hyperlipidemia     History reviewed. No pertinent surgical history.  Social History   Socioeconomic History   Marital status: Divorced    Spouse name: Not on file   Number of children: Not on file   Years of education: Not on file   Highest education level: Not on file  Occupational History   Not on file  Tobacco Use   Smoking status: Every Day    Types: Cigars   Smokeless tobacco: Never  Vaping Use   Vaping status: Never Used  Substance and Sexual Activity   Alcohol use: Yes   Drug use: Never   Sexual activity: Not on file  Other Topics Concern   Not on file  Social History Narrative   Not on file   Social Determinants of Health   Financial Resource Strain: Not on file  Food Insecurity: Not on file  Transportation Needs: Not on file  Physical Activity: Not on file  Stress: Not on file  Social Connections: Not on file  Intimate Partner Violence: Not on file   History reviewed. No pertinent family history.  Current Outpatient Medications  Medication Sig Dispense Refill   aspirin EC 81 MG tablet Take 81 mg by mouth daily. Swallow whole.     Blood Glucose Monitoring Suppl (ONETOUCH  VERIO REFLECT) w/Device KIT See admin instructions.     glucose blood (ONETOUCH VERIO) test strip for use when checking blood sugars     Lancets (ONETOUCH DELICA PLUS [id number]) MISC for use when checking home glucose readings     lovastatin (MEVACOR) 40 MG tablet Take 40 mg by mouth daily.     Multiple Vitamin (MULTIVITAMIN ADULT PO) 1 tablet     naproxen sodium (ALEVE) 220 MG tablet 2 1/2 tablet     ONETOUCH VERIO test strip 2 (two) times daily.     No current facility-administered medications for this visit.    No Known Allergies   REVIEW OF SYSTEMS:  [X]  denotes positive finding, [ ]  denotes negative finding Cardiac  Comments:  Chest pain or chest pressure:    Shortness of breath upon exertion:    Short of breath when lying flat:    Irregular heart rhythm:        Vascular    Pain in calf, thigh, or hip brought on by ambulation:    Pain in feet at night that wakes you up from your sleep:     Blood clot in your veins:    Leg swelling:         Pulmonary    Oxygen at home:  Productive cough:     Wheezing:         Neurologic    Sudden weakness in arms or legs:     Sudden numbness in arms or legs:     Sudden onset of difficulty speaking or slurred speech:    Temporary loss of vision in one eye:     Problems with dizziness:         Gastrointestinal    Blood in stool:     Vomited blood:         Genitourinary    Burning when urinating:     Blood in urine:        Psychiatric    Major depression:         Hematologic    Bleeding problems:    Problems with blood clotting too easily:        Skin    Rashes or ulcers:        Constitutional    Fever or chills:      PHYSICAL EXAMINATION:  Vitals:   11/04/22 1428  BP: (!) 152/72  Pulse: 76  Resp: 18  Temp: 98 F (36.7 C)  TempSrc: Temporal  SpO2: 97%  Weight: 178 lb (80.7 kg)  Height: 5\' 9"  (1.753 m)    General:  WDWN in NAD; vital signs documented above Gait: Normal HENT: WNL,  normocephalic Pulmonary: normal non-labored breathing without wheezing Cardiac: regular HR Abdomen: soft Vascular Exam/Pulses: 2+ femoral, 2+ PT bilaterally, absent DP pulses. Feet warm and well perfused Extremities: without ischemic changes, without Gangrene , without cellulitis; without open wounds;  Musculoskeletal: no muscle wasting or atrophy  Neurologic: A&O X 3 Psychiatric:  The pt has Normal affect.   Non-Invasive Vascular Imaging:   +-------+-----------+-----------+------------+------------+  ABI/TBIToday's ABIToday's TBIPrevious ABIPrevious TBI  +-------+-----------+-----------+------------+------------+  Right 0.97       0.57       0.93        0.46          +-------+-----------+-----------+------------+------------+  Left  0.95       0.66       1.01        0.51          +-------+-----------+-----------+------------+------------+   ASSESSMENT/PLAN:: 72 y.o. male here for follow up for PAD. He has had claudication symptoms in BLE.His symptoms remain non disabling. ABI today is essentially unchanged from last year.  - Continue exercise regimen  - Continue Statin and Aspirin - Encourage smoking cessation - He will follows up in 1 year with ABI   Graceann Congress, PA-C [facility] [phone]  Clinic MD:   Edilia Bo

## 2022-11-10 ENCOUNTER — Other Ambulatory Visit: Payer: Self-pay

## 2022-11-10 DIAGNOSIS — I739 Peripheral vascular disease, unspecified: Secondary | ICD-10-CM

## 2022-12-17 DIAGNOSIS — L821 Other seborrheic keratosis: Secondary | ICD-10-CM | POA: Diagnosis not present

## 2022-12-17 DIAGNOSIS — Z85828 Personal history of other malignant neoplasm of skin: Secondary | ICD-10-CM | POA: Diagnosis not present

## 2022-12-17 DIAGNOSIS — L905 Scar conditions and fibrosis of skin: Secondary | ICD-10-CM | POA: Diagnosis not present

## 2022-12-17 DIAGNOSIS — L570 Actinic keratosis: Secondary | ICD-10-CM | POA: Diagnosis not present

## 2022-12-17 DIAGNOSIS — D1801 Hemangioma of skin and subcutaneous tissue: Secondary | ICD-10-CM | POA: Diagnosis not present

## 2022-12-17 DIAGNOSIS — Z86018 Personal history of other benign neoplasm: Secondary | ICD-10-CM | POA: Diagnosis not present

## 2022-12-17 DIAGNOSIS — D485 Neoplasm of uncertain behavior of skin: Secondary | ICD-10-CM | POA: Diagnosis not present

## 2022-12-18 DIAGNOSIS — F17200 Nicotine dependence, unspecified, uncomplicated: Secondary | ICD-10-CM | POA: Diagnosis not present

## 2022-12-18 DIAGNOSIS — T63481A Toxic effect of venom of other arthropod, accidental (unintentional), initial encounter: Secondary | ICD-10-CM | POA: Diagnosis not present

## 2022-12-18 DIAGNOSIS — R0689 Other abnormalities of breathing: Secondary | ICD-10-CM | POA: Diagnosis not present

## 2022-12-18 DIAGNOSIS — Z049 Encounter for examination and observation for unspecified reason: Secondary | ICD-10-CM | POA: Diagnosis not present

## 2022-12-18 DIAGNOSIS — L500 Allergic urticaria: Secondary | ICD-10-CM | POA: Diagnosis not present

## 2022-12-18 DIAGNOSIS — T63441A Toxic effect of venom of bees, accidental (unintentional), initial encounter: Secondary | ICD-10-CM | POA: Diagnosis not present

## 2022-12-18 DIAGNOSIS — T782XXA Anaphylactic shock, unspecified, initial encounter: Secondary | ICD-10-CM | POA: Diagnosis not present

## 2022-12-21 DIAGNOSIS — Z13220 Encounter for screening for lipoid disorders: Secondary | ICD-10-CM | POA: Diagnosis not present

## 2022-12-21 DIAGNOSIS — R7301 Impaired fasting glucose: Secondary | ICD-10-CM | POA: Diagnosis not present

## 2022-12-21 DIAGNOSIS — Z0000 Encounter for general adult medical examination without abnormal findings: Secondary | ICD-10-CM | POA: Diagnosis not present

## 2022-12-21 DIAGNOSIS — F17290 Nicotine dependence, other tobacco product, uncomplicated: Secondary | ICD-10-CM | POA: Diagnosis not present

## 2022-12-21 DIAGNOSIS — Z79899 Other long term (current) drug therapy: Secondary | ICD-10-CM | POA: Diagnosis not present

## 2022-12-21 DIAGNOSIS — I739 Peripheral vascular disease, unspecified: Secondary | ICD-10-CM | POA: Diagnosis not present

## 2022-12-21 DIAGNOSIS — T63461D Toxic effect of venom of wasps, accidental (unintentional), subsequent encounter: Secondary | ICD-10-CM | POA: Diagnosis not present

## 2022-12-21 DIAGNOSIS — E7849 Other hyperlipidemia: Secondary | ICD-10-CM | POA: Diagnosis not present

## 2022-12-21 DIAGNOSIS — R351 Nocturia: Secondary | ICD-10-CM | POA: Diagnosis not present

## 2023-01-07 DIAGNOSIS — D044 Carcinoma in situ of skin of scalp and neck: Secondary | ICD-10-CM | POA: Diagnosis not present

## 2023-01-07 DIAGNOSIS — L570 Actinic keratosis: Secondary | ICD-10-CM | POA: Diagnosis not present

## 2023-01-07 DIAGNOSIS — D485 Neoplasm of uncertain behavior of skin: Secondary | ICD-10-CM | POA: Diagnosis not present

## 2023-01-08 DIAGNOSIS — Z23 Encounter for immunization: Secondary | ICD-10-CM | POA: Diagnosis not present

## 2023-01-17 DIAGNOSIS — R220 Localized swelling, mass and lump, head: Secondary | ICD-10-CM | POA: Diagnosis not present

## 2023-01-17 DIAGNOSIS — T63451D Toxic effect of venom of hornets, accidental (unintentional), subsequent encounter: Secondary | ICD-10-CM | POA: Diagnosis not present

## 2023-01-17 DIAGNOSIS — Z87892 Personal history of anaphylaxis: Secondary | ICD-10-CM | POA: Diagnosis not present

## 2023-02-18 DIAGNOSIS — D044 Carcinoma in situ of skin of scalp and neck: Secondary | ICD-10-CM | POA: Diagnosis not present

## 2023-02-18 DIAGNOSIS — L570 Actinic keratosis: Secondary | ICD-10-CM | POA: Diagnosis not present

## 2023-10-24 ENCOUNTER — Other Ambulatory Visit: Payer: Self-pay

## 2023-10-24 DIAGNOSIS — I739 Peripheral vascular disease, unspecified: Secondary | ICD-10-CM

## 2023-11-03 ENCOUNTER — Ambulatory Visit (HOSPITAL_COMMUNITY)
Admission: RE | Admit: 2023-11-03 | Discharge: 2023-11-03 | Disposition: A | Discharge status: Home / Self Care | Source: Ambulatory Visit | Attending: Vascular Surgery | Admitting: Vascular Surgery

## 2023-11-03 ENCOUNTER — Ambulatory Visit

## 2023-11-03 VITALS — BP 139/73 | HR 72 | Temp 97.7°F | Wt 178.8 lb

## 2023-11-03 DIAGNOSIS — I739 Peripheral vascular disease, unspecified: Secondary | ICD-10-CM | POA: Insufficient documentation

## 2023-11-03 LAB — VAS US ABI WITH/WO TBI
Left ABI: 0.9
Right ABI: 0.9

## 2023-11-03 NOTE — Progress Notes (Signed)
 HISTORY AND PHYSICAL     CC:  follow up. Requesting Provider:  Gib Charleston, MD  HPI: This is a 73 y.o. male who is here today for follow up for PAD.  He has a known left SFA occlusion. He has been managing his claudication symptoms with lifestyle modifications.   Pt was last seen 11/04/2022 and at that time, he was managing his claudication with lifestyle modifications.  His legs were equal.  He was exercising about 5 times a week.  He did not have rest pain or non healing wounds.  He was smoking a cigar daily.   The pt returns today for follow up.  He states he still gets some cramping at times and varies with how long he walks.  Sometimes it is 15 minutes, sometimes it is 30 minutes.  After about 30 minutes he gets a numbness on the bottom of both feet and this improves with rest.  He is not taking an aspirin.  He is taking his statin.  He smokes one cigar daily.  He has plantar fascitis.   The pt is not on a statin for cholesterol management.    The pt is on an aspirin.    Other AC:  none The pt is not on medication for hypertension.  The pt is  on diabetic medication. Tobacco hx:  current  Pt does not have family hx of AAA.  Past Medical History:  Diagnosis Date   Hyperlipidemia     No past surgical history on file.  No Known Allergies  Current Outpatient Medications  Medication Sig Dispense Refill   aspirin EC 81 MG tablet Take 81 mg by mouth daily. Swallow whole.     Blood Glucose Monitoring Suppl (ONETOUCH VERIO REFLECT) w/Device KIT See admin instructions.     glucose blood (ONETOUCH VERIO) test strip for use when checking blood sugars     Lancets (ONETOUCH DELICA PLUS LANCET30G) MISC for use when checking home glucose readings     lovastatin (MEVACOR) 40 MG tablet Take 40 mg by mouth daily.     Multiple Vitamin (MULTIVITAMIN ADULT PO) 1 tablet     naproxen sodium (ALEVE) 220 MG tablet 2 1/2 tablet     ONETOUCH VERIO test strip 2 (two) times daily.     No current  facility-administered medications for this visit.    No family history on file.  Social History   Socioeconomic History   Marital status: Divorced    Spouse name: Not on file   Number of children: Not on file   Years of education: Not on file   Highest education level: Not on file  Occupational History   Not on file  Tobacco Use   Smoking status: Every Day    Types: Cigars   Smokeless tobacco: Never  Vaping Use   Vaping status: Never Used  Substance and Sexual Activity   Alcohol use: Yes   Drug use: Never   Sexual activity: Not on file  Other Topics Concern   Not on file  Social History Narrative   Not on file   Social Drivers of Health   Financial Resource Strain: Not on file  Food Insecurity: Low Risk  (12/14/2022)   Received from [facility]   Hunger Vital Sign    Within the past 12 months, you worried that your food would run out before you got money to buy more: Never true    Within the past 12 months, the food you bought just didn't  last and you didn't have money to get more. : Never true  Transportation Needs: No Transportation Needs (12/14/2022)   Received from [facility]    In the past 12 months, has lack of reliable transportation kept you from medical appointments, meetings, work or from getting things needed for daily living? : No  Physical Activity: Not on file  Stress: Not on file  Social Connections: Not on file  Intimate Partner Violence: Not on file     REVIEW OF SYSTEMS:   [X]  denotes positive finding, [ ]  denotes negative finding Cardiac  Comments:  Chest pain or chest pressure:    Shortness of breath upon exertion:    Short of breath when lying flat:    Irregular heart rhythm:        Vascular    Pain in calf, thigh, or hip brought on by ambulation:    Pain in feet at night that wakes you up from your sleep:     Blood clot in your veins:    Leg swelling:         Pulmonary    Oxygen at home:    Productive cough:      Wheezing:         Neurologic    Sudden weakness in arms or legs:     Sudden numbness in arms or legs:     Sudden onset of difficulty speaking or slurred speech:    Temporary loss of vision in one eye:     Problems with dizziness:         Gastrointestinal    Blood in stool:     Vomited blood:         Genitourinary    Burning when urinating:     Blood in urine:        Psychiatric    Major depression:         Hematologic    Bleeding problems:    Problems with blood clotting too easily:        Skin    Rashes or ulcers:        Constitutional    Fever or chills:      PHYSICAL EXAMINATION:  Today's Vitals   11/03/23 0907  BP: 139/73  Pulse: 72  Temp: 97.7 F (36.5 C)  TempSrc: Temporal  Weight: 178 lb 12.8 oz (81.1 kg)  PainSc: 0-No pain   Body mass index is 26.4 kg/m.   General:  WDWN in NAD; vital signs documented above Gait: Not observed HENT: WNL, normocephalic Pulmonary: normal non-labored breathing , without wheezing Cardiac: regular HR, without carotid bruits Abdomen: soft, NT; aortic pulse is not palpable Skin: without rashes Vascular Exam/Pulses:  Right Left  Radial 2+ (normal) 2+ (normal)  DP 2+ (normal) 2+ (normal)  PT 2+ (normal) 2+ (normal)   Extremities: without ischemic changes, without Gangrene , without cellulitis; without open wounds Musculoskeletal: no muscle wasting or atrophy  Neurologic: A&O X 3 Psychiatric:  The pt has Normal affect.   Non-Invasive Vascular Imaging:   ABI's/TBI's on 11/03/2023: Right:  0.90/0.66 - Great toe pressure: 97 Left:  0.90/0.67 - Great toe pressure: 98   Previous ABI's/TBI's on 11/04/2022: Right:  0.97/0.57 - Great toe pressure: 89 Left:  0.95/0.66 - Great toe pressure:  103    ASSESSMENT/PLAN:: 73 y.o. male here for follow up for PAD.  He has a known left SFA occlusion. He has been managing his claudication symptoms with lifestyle modifications.    -  pt doing well with palpable pedal pulses  bilaterally.  He does get some cramping in both legs with walking at times.  He is doing a good job keeping up his walking regimen.  Encouraged him to continue this.  -continue statin -discussed importance of increased walking daily -discussed with him to check out stretching exercises for his plantar fasciitis and have good support in his shoes.  -pt will f/u in one year with ABI.  Discussed if he develops rest pain or wounds, we would see him sooner.     Lucie Apt, [facility] [phone]  Clinic MD:   [place]
